# Patient Record
Sex: Female | Born: 1940 | Hispanic: No | Marital: Single
Health system: Southern US, Community
[De-identification: ages and names within clinical notes are randomized; demographics above are authoritative.]

---

## 2009-04-03 ENCOUNTER — Other Ambulatory Visit: Admission: RE | Admit: 2009-04-03 | Discharge: 2009-04-03 | Payer: Self-pay | Admitting: Family Medicine

## 2009-04-23 ENCOUNTER — Encounter: Admission: RE | Admit: 2009-04-23 | Discharge: 2009-04-23 | Payer: Self-pay | Admitting: Family Medicine

## 2010-04-28 ENCOUNTER — Encounter: Admission: RE | Admit: 2010-04-28 | Discharge: 2010-04-28 | Payer: Self-pay | Admitting: Family Medicine

## 2011-04-22 ENCOUNTER — Other Ambulatory Visit: Payer: Self-pay | Admitting: Family Medicine

## 2011-04-22 DIAGNOSIS — Z1231 Encounter for screening mammogram for malignant neoplasm of breast: Secondary | ICD-10-CM

## 2011-05-03 ENCOUNTER — Other Ambulatory Visit (HOSPITAL_COMMUNITY)
Admission: RE | Admit: 2011-05-03 | Discharge: 2011-05-03 | Disposition: A | Discharge status: Home / Self Care | Payer: BC Managed Care – PPO | Source: Ambulatory Visit | Attending: Family Medicine | Admitting: Family Medicine

## 2011-05-03 DIAGNOSIS — Z124 Encounter for screening for malignant neoplasm of cervix: Secondary | ICD-10-CM | POA: Insufficient documentation

## 2011-05-04 ENCOUNTER — Ambulatory Visit
Admission: RE | Admit: 2011-05-04 | Discharge: 2011-05-04 | Disposition: A | Discharge status: Home / Self Care | Payer: BC Managed Care – PPO | Source: Ambulatory Visit | Attending: Family Medicine | Admitting: Family Medicine

## 2011-05-04 DIAGNOSIS — Z1231 Encounter for screening mammogram for malignant neoplasm of breast: Secondary | ICD-10-CM

## 2011-05-05 ENCOUNTER — Ambulatory Visit: Payer: Self-pay

## 2012-04-18 ENCOUNTER — Other Ambulatory Visit: Payer: Self-pay | Admitting: Family Medicine

## 2012-04-18 DIAGNOSIS — Z1231 Encounter for screening mammogram for malignant neoplasm of breast: Secondary | ICD-10-CM

## 2012-05-04 ENCOUNTER — Ambulatory Visit
Admission: RE | Admit: 2012-05-04 | Discharge: 2012-05-04 | Disposition: A | Discharge status: Home / Self Care | Payer: BC Managed Care – PPO | Source: Ambulatory Visit | Attending: Family Medicine | Admitting: Family Medicine

## 2012-05-04 DIAGNOSIS — Z1231 Encounter for screening mammogram for malignant neoplasm of breast: Secondary | ICD-10-CM

## 2013-04-23 ENCOUNTER — Other Ambulatory Visit: Payer: Self-pay

## 2013-04-23 DIAGNOSIS — Z1231 Encounter for screening mammogram for malignant neoplasm of breast: Secondary | ICD-10-CM

## 2013-05-06 ENCOUNTER — Ambulatory Visit
Admission: RE | Admit: 2013-05-06 | Discharge: 2013-05-06 | Disposition: A | Discharge status: Home / Self Care | Payer: BC Managed Care – PPO | Source: Ambulatory Visit

## 2013-05-06 DIAGNOSIS — Z1231 Encounter for screening mammogram for malignant neoplasm of breast: Secondary | ICD-10-CM

## 2014-04-22 ENCOUNTER — Other Ambulatory Visit: Payer: Self-pay

## 2014-04-22 DIAGNOSIS — Z1231 Encounter for screening mammogram for malignant neoplasm of breast: Secondary | ICD-10-CM

## 2014-05-09 ENCOUNTER — Encounter (INDEPENDENT_AMBULATORY_CARE_PROVIDER_SITE_OTHER): Payer: Self-pay

## 2014-05-09 ENCOUNTER — Ambulatory Visit
Admission: RE | Admit: 2014-05-09 | Discharge: 2014-05-09 | Disposition: A | Discharge status: Home / Self Care | Payer: BC Managed Care – PPO | Source: Ambulatory Visit

## 2014-05-09 DIAGNOSIS — Z1231 Encounter for screening mammogram for malignant neoplasm of breast: Secondary | ICD-10-CM

## 2015-02-19 ENCOUNTER — Encounter: Payer: Self-pay | Admitting: Family Medicine

## 2015-05-06 ENCOUNTER — Other Ambulatory Visit: Payer: Self-pay

## 2015-05-06 DIAGNOSIS — Z1231 Encounter for screening mammogram for malignant neoplasm of breast: Secondary | ICD-10-CM

## 2015-05-11 ENCOUNTER — Ambulatory Visit
Admission: RE | Admit: 2015-05-11 | Discharge: 2015-05-11 | Disposition: A | Discharge status: Home / Self Care | Payer: Medicare Other | Source: Ambulatory Visit

## 2015-05-11 DIAGNOSIS — Z1231 Encounter for screening mammogram for malignant neoplasm of breast: Secondary | ICD-10-CM

## 2016-06-14 ENCOUNTER — Other Ambulatory Visit: Payer: Self-pay | Admitting: Family Medicine

## 2016-06-14 DIAGNOSIS — Z1231 Encounter for screening mammogram for malignant neoplasm of breast: Secondary | ICD-10-CM

## 2016-06-23 ENCOUNTER — Ambulatory Visit
Admission: RE | Admit: 2016-06-23 | Discharge: 2016-06-23 | Disposition: A | Discharge status: Home / Self Care | Payer: Medicare Other | Source: Ambulatory Visit | Attending: Family Medicine | Admitting: Family Medicine

## 2016-06-23 DIAGNOSIS — Z1231 Encounter for screening mammogram for malignant neoplasm of breast: Secondary | ICD-10-CM

## 2017-05-24 ENCOUNTER — Other Ambulatory Visit: Payer: Self-pay | Admitting: Family Medicine

## 2017-05-24 DIAGNOSIS — Z1231 Encounter for screening mammogram for malignant neoplasm of breast: Secondary | ICD-10-CM

## 2017-06-27 ENCOUNTER — Ambulatory Visit
Admission: RE | Admit: 2017-06-27 | Discharge: 2017-06-27 | Disposition: A | Discharge status: Home / Self Care | Payer: Medicare Other | Source: Ambulatory Visit | Attending: Family Medicine | Admitting: Family Medicine

## 2017-06-27 DIAGNOSIS — Z1231 Encounter for screening mammogram for malignant neoplasm of breast: Secondary | ICD-10-CM

## 2018-06-20 ENCOUNTER — Other Ambulatory Visit: Payer: Self-pay | Admitting: Family Medicine

## 2018-06-20 DIAGNOSIS — Z1231 Encounter for screening mammogram for malignant neoplasm of breast: Secondary | ICD-10-CM

## 2018-07-17 ENCOUNTER — Ambulatory Visit
Admission: RE | Admit: 2018-07-17 | Discharge: 2018-07-17 | Disposition: A | Discharge status: Home / Self Care | Payer: Medicare Other | Source: Ambulatory Visit | Attending: Family Medicine | Admitting: Family Medicine

## 2018-07-17 DIAGNOSIS — Z1231 Encounter for screening mammogram for malignant neoplasm of breast: Secondary | ICD-10-CM

## 2019-06-17 ENCOUNTER — Other Ambulatory Visit: Payer: Self-pay | Admitting: Family Medicine

## 2019-06-17 DIAGNOSIS — Z1231 Encounter for screening mammogram for malignant neoplasm of breast: Secondary | ICD-10-CM

## 2019-07-30 ENCOUNTER — Ambulatory Visit: Payer: Medicare Other

## 2019-07-31 ENCOUNTER — Other Ambulatory Visit: Payer: Self-pay

## 2019-07-31 ENCOUNTER — Ambulatory Visit
Admission: RE | Admit: 2019-07-31 | Discharge: 2019-07-31 | Disposition: A | Discharge status: Home / Self Care | Payer: Medicare Other | Source: Ambulatory Visit | Attending: Family Medicine | Admitting: Family Medicine

## 2019-07-31 DIAGNOSIS — Z1231 Encounter for screening mammogram for malignant neoplasm of breast: Secondary | ICD-10-CM

## 2020-01-04 ENCOUNTER — Ambulatory Visit: Payer: Medicare Other | Attending: Internal Medicine

## 2020-01-04 DIAGNOSIS — Z23 Encounter for immunization: Secondary | ICD-10-CM | POA: Insufficient documentation

## 2020-01-04 NOTE — Progress Notes (Signed)
   Covid-19 Vaccination Clinic  Name:  April Nunez    MRN: [id number] DOB: February 28, 1941  01/04/2020  April Nunez was observed post Covid-19 immunization for 15 minutes without incidence. She was provided with Vaccine Information Sheet and instruction to access the V-Safe system.   April Nunez was instructed to call 911 with any severe reactions post vaccine: Marland Kitchen Difficulty breathing  . Swelling of your face and throat  . A fast heartbeat  . A bad rash all over your body  . Dizziness and weakness    Immunizations Administered    Name Date Dose VIS Date Route   Pfizer COVID-19 Vaccine 01/04/2020  3:16 PM 0.3 mL 10/25/2019 Intramuscular   Manufacturer: [employer], [employer]   Lot: [id number]   NDC: [address]-9

## 2020-01-27 ENCOUNTER — Ambulatory Visit: Payer: Medicare Other

## 2020-01-29 ENCOUNTER — Ambulatory Visit: Payer: Medicare Other | Attending: Internal Medicine

## 2020-01-29 DIAGNOSIS — Z23 Encounter for immunization: Secondary | ICD-10-CM

## 2020-01-29 NOTE — Progress Notes (Signed)
   Covid-19 Vaccination Clinic  Name:  Aalliyah Kilker    MRN: [id number] DOB: [birth year]  01/29/2020  Ms. Stare was observed post Covid-19 immunization for 15 minutes without incident. She was provided with Vaccine Information Sheet and instruction to access the V-Safe system.   Ms. Rojek was instructed to call 911 with any severe reactions post vaccine: Marland Kitchen Difficulty breathing  . Swelling of face and throat  . A fast heartbeat  . A bad rash all over body  . Dizziness and weakness   Immunizations Administered    Name Date Dose VIS Date Route   Pfizer COVID-19 Vaccine 01/29/2020  1:04 PM 0.3 mL 10/25/2019 Intramuscular   Manufacturer: [employer], [employer]   Lot: [id number]   NDC: [address]-6

## 2020-07-22 ENCOUNTER — Other Ambulatory Visit: Payer: Self-pay | Admitting: Family Medicine

## 2020-07-22 DIAGNOSIS — Z1231 Encounter for screening mammogram for malignant neoplasm of breast: Secondary | ICD-10-CM

## 2020-08-04 ENCOUNTER — Other Ambulatory Visit: Payer: Self-pay

## 2020-08-04 ENCOUNTER — Ambulatory Visit
Admission: RE | Admit: 2020-08-04 | Discharge: 2020-08-04 | Disposition: A | Discharge status: Home / Self Care | Payer: Medicare Other | Source: Ambulatory Visit | Attending: Family Medicine | Admitting: Family Medicine

## 2020-08-04 DIAGNOSIS — Z1231 Encounter for screening mammogram for malignant neoplasm of breast: Secondary | ICD-10-CM

## 2020-10-06 ENCOUNTER — Other Ambulatory Visit: Payer: Self-pay | Admitting: *Deleted

## 2020-10-06 ENCOUNTER — Other Ambulatory Visit: Payer: Medicare Other

## 2020-10-06 DIAGNOSIS — Z20822 Contact with and (suspected) exposure to covid-19: Secondary | ICD-10-CM

## 2020-10-08 LAB — SARS-COV-2, NAA 2 DAY TAT

## 2020-10-08 LAB — NOVEL CORONAVIRUS, NAA: SARS-CoV-2, NAA: DETECTED — AB

## 2020-10-08 LAB — SPECIMEN STATUS REPORT

## 2020-10-09 ENCOUNTER — Telehealth: Payer: Self-pay | Admitting: Nurse Practitioner

## 2020-10-09 NOTE — Telephone Encounter (Signed)
Called to discuss with April Nunez about Covid symptoms and the use of monoclonal antibody infusion for those with mild to moderate Covid symptoms and at a high risk of hospitalization.     Pt does not qualify for infusion therapy as she has asymptomatic infection. Isolation precautions discussed. Advised to contact back for consideration should she develop symptoms. Patient verbalized understanding.     Willette Alma, AGPCNP-BC

## 2020-10-31 ENCOUNTER — Ambulatory Visit: Payer: Medicare Other

## 2020-12-01 ENCOUNTER — Other Ambulatory Visit: Payer: Self-pay | Admitting: Family Medicine

## 2020-12-01 DIAGNOSIS — M8588 Other specified disorders of bone density and structure, other site: Secondary | ICD-10-CM

## 2021-01-22 ENCOUNTER — Ambulatory Visit: Payer: Medicare Other | Attending: Internal Medicine

## 2021-01-22 DIAGNOSIS — Z23 Encounter for immunization: Secondary | ICD-10-CM

## 2021-01-22 NOTE — Progress Notes (Signed)
   Covid-19 Vaccination Clinic  Name:  Elga Santy    MRN: [id number] DOB: 25-Feb-1941  01/22/2021  Ms. Petronio was observed post Covid-19 immunization for 15 minutes without incident. She was provided with Vaccine Information Sheet and instruction to access the V-Safe system.   Ms. Decuir was instructed to call 911 with any severe reactions post vaccine: Marland Kitchen Difficulty breathing  . Swelling of face and throat  . A fast heartbeat  . A bad rash all over body  . Dizziness and weakness   Immunizations Administered    Name Date Dose VIS Date Route   PFIZER Comrnaty(Gray TOP) Covid-19 Vaccine 01/22/2021  2:04 PM 0.3 mL 10/22/2020 Intramuscular   Manufacturer: [employer], [employer]   Lot: [id number]   NDC: [phone]      Covid-19 Vaccination Clinic  Name:  Anjalee Cope    MRN: [id number] DOB: 02-10-1941  01/22/2021  Ms. Pollet was observed post Covid-19 immunization for 15 minutes without incident. She was provided with Vaccine Information Sheet and instruction to access the V-Safe system.   Ms. Starkes was instructed to call 911 with any severe reactions post vaccine: Marland Kitchen Difficulty breathing  . Swelling of face and throat  . A fast heartbeat  . A bad rash all over body  . Dizziness and weakness   Immunizations Administered    Name Date Dose VIS Date Route   PFIZER Comrnaty(Gray TOP) Covid-19 Vaccine 01/22/2021  2:04 PM 0.3 mL 10/22/2020 Intramuscular   Manufacturer: [employer], [employer]   Lot: [id number]   NDC: [phone]

## 2021-02-07 IMAGING — MG MM DIGITAL SCREENING BILAT W/ TOMO W/ CAD
6 of 10 series · 6 of 30 positions shown · non-contrast
Comparison: Previous exam(s).

CLINICAL DATA: Screening.

EXAM:
DIGITAL SCREENING BILATERAL MAMMOGRAM WITH TOMO AND CAD

[R CC synth-2D (1 of 2)]
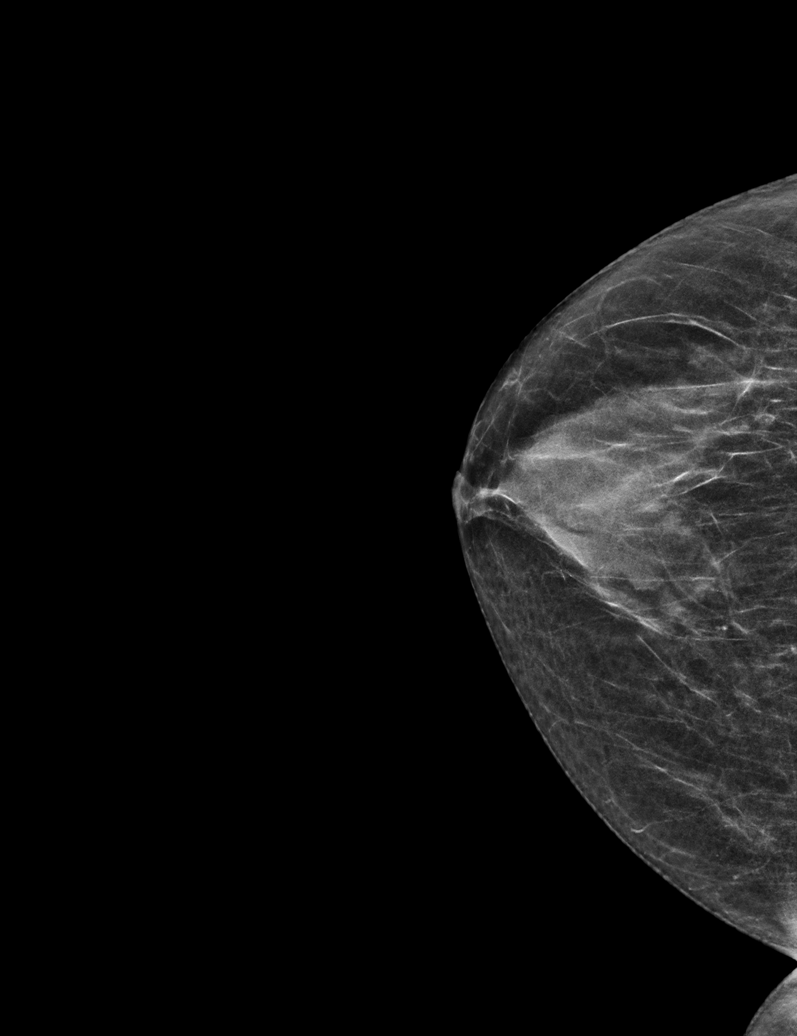

[R CC synth-2D (2 of 2)]
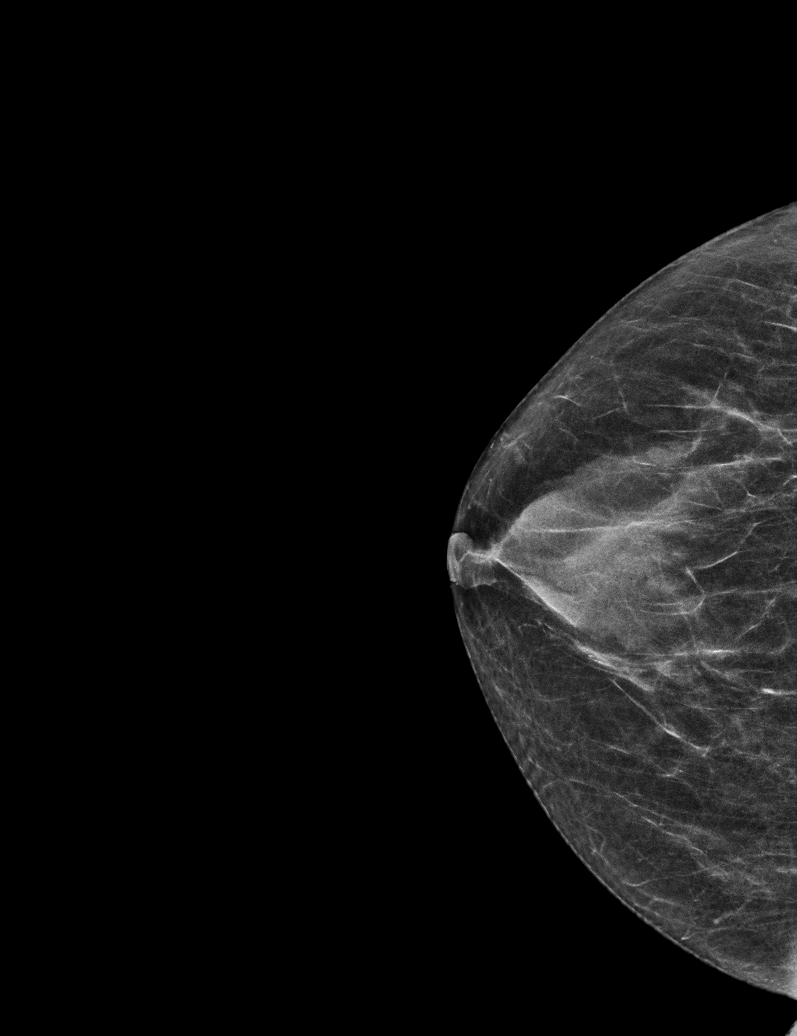

[L CC synth-2D]
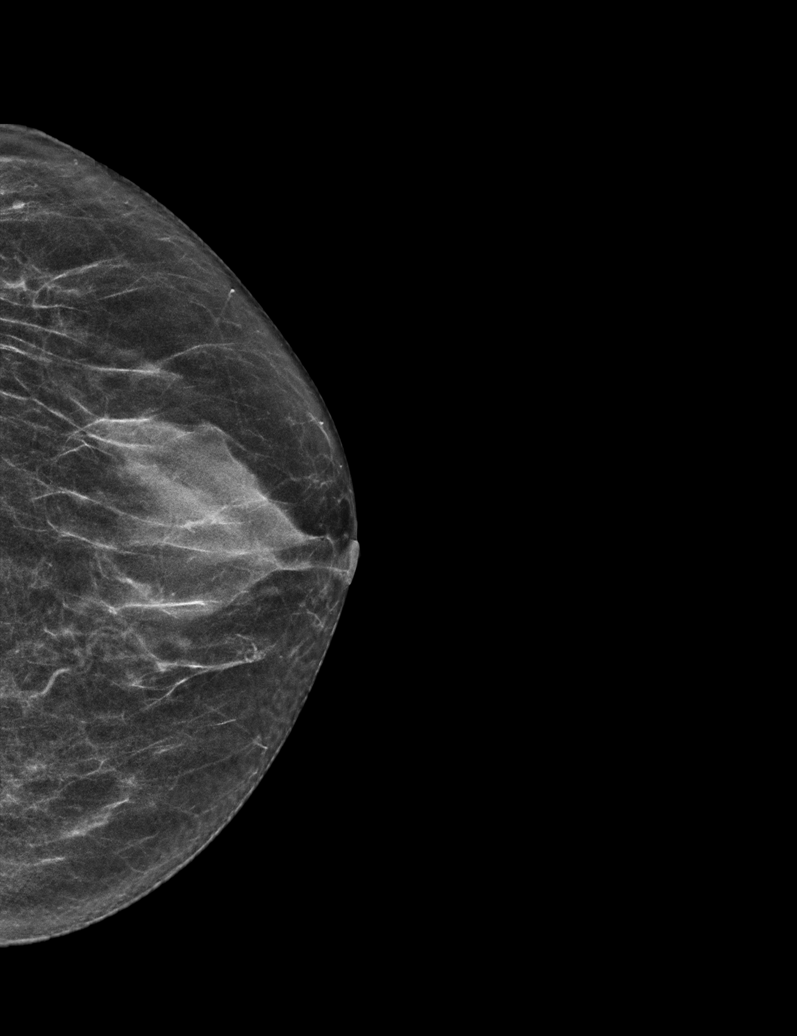

[L MLO synth-2D]
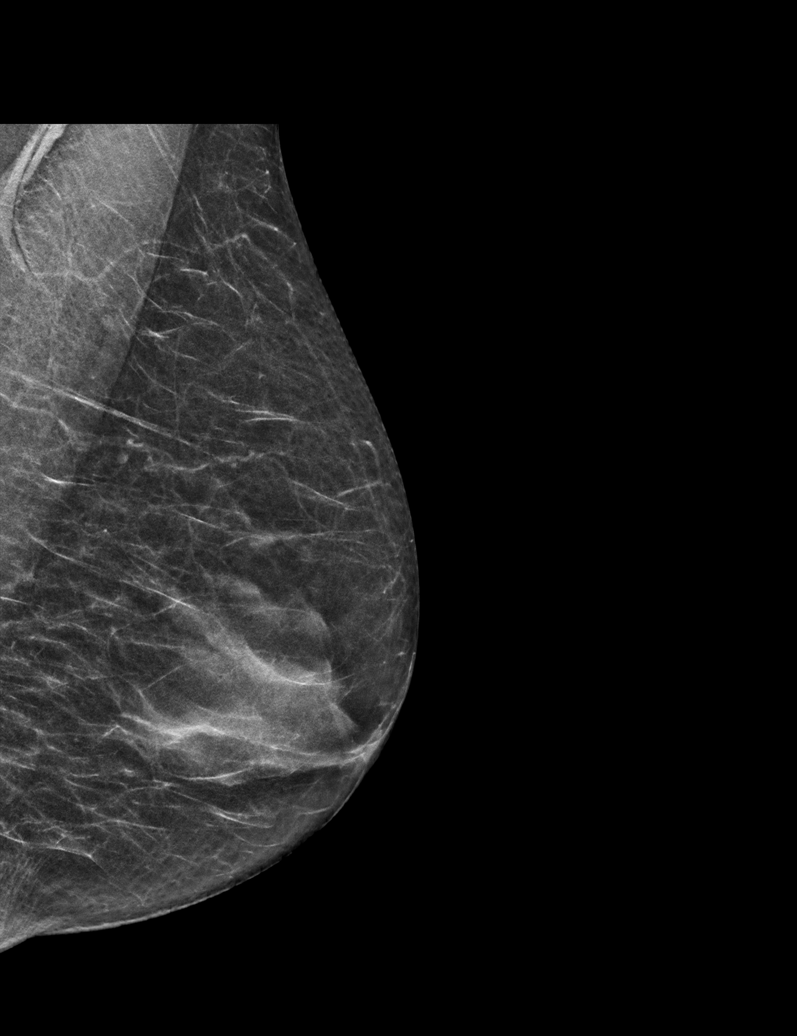

[R MLO synth-2D]
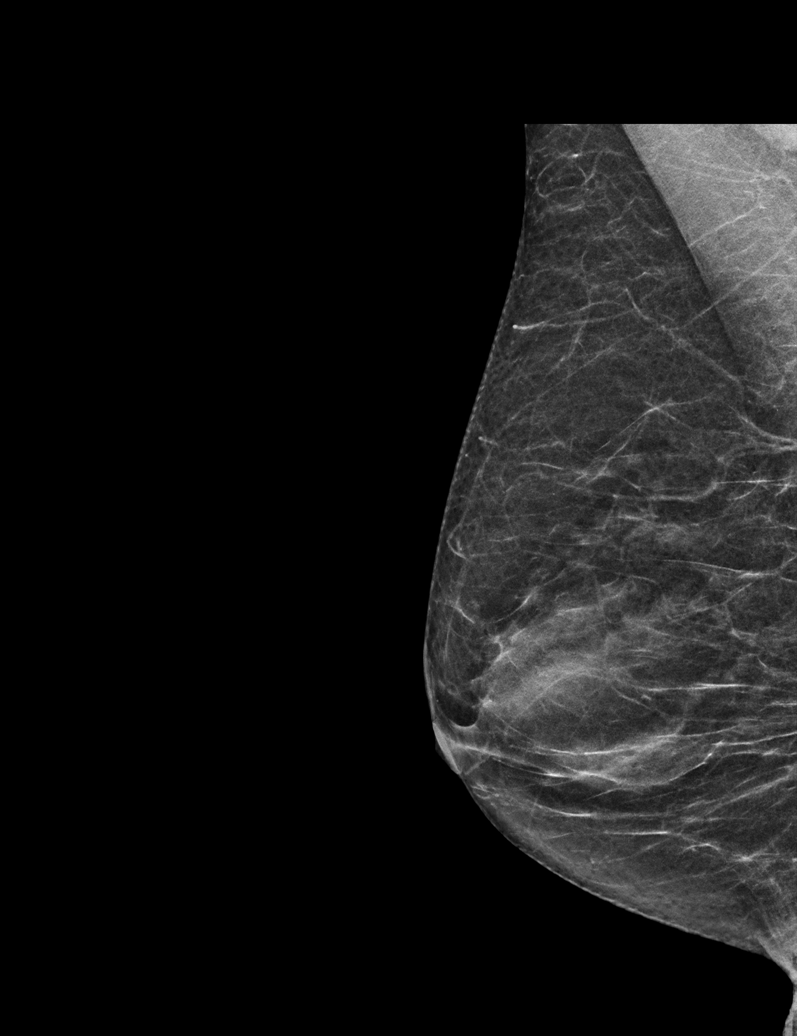

[L MLO tomo · tomo slice 26/51.0]
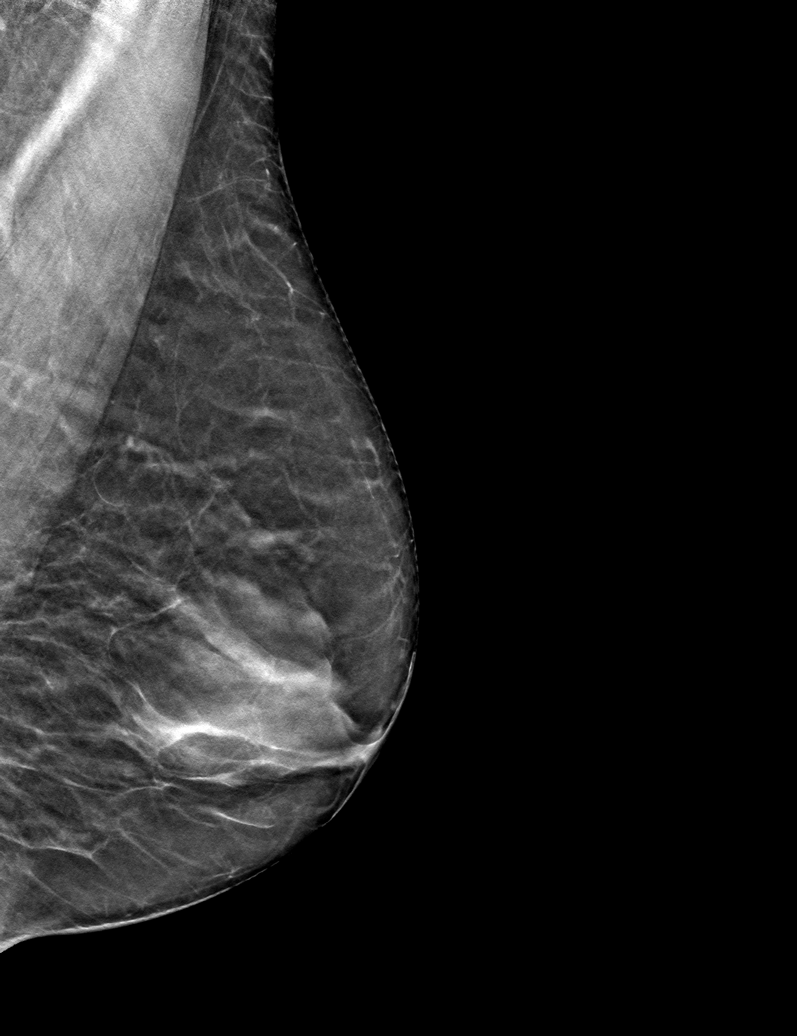

[6 of 30 positions shown; findings below may reference images not displayed]

ACR Breast Density Category c: The breast tissue is heterogeneously
dense, which may obscure small masses.
FINDINGS: There are no findings suspicious for malignancy. Images were
processed with CAD.
IMPRESSION: No mammographic evidence of malignancy. A result letter of this
screening mammogram will be mailed directly to the patient.

RECOMMENDATION:
Screening mammogram in one year. (Code:[id number])

BI-RADS CATEGORY  1: Negative.

## 2021-03-30 ENCOUNTER — Other Ambulatory Visit: Payer: Medicare Other
